# Patient Record
Sex: Male | Born: 1995 | Hispanic: Yes | Marital: Single | Smoking: Never smoker
Health system: Southern US, Community
[De-identification: ages and names within clinical notes are randomized; demographics above are authoritative.]

## PROBLEM LIST (undated history)

## (undated) HISTORY — PX: FRACTURE SURGERY: SHX138

---
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
## 2019-01-03 ENCOUNTER — Other Ambulatory Visit: Payer: Self-pay | Admitting: Chiropractor

## 2019-01-03 ENCOUNTER — Ambulatory Visit
Admission: RE | Admit: 2019-01-03 | Discharge: 2019-01-03 | Disposition: A | Discharge status: Home / Self Care | Payer: No Typology Code available for payment source | Source: Ambulatory Visit | Attending: Chiropractor | Admitting: Chiropractor

## 2019-01-03 DIAGNOSIS — M4326 Fusion of spine, lumbar region: Secondary | ICD-10-CM | POA: Diagnosis not present

## 2019-01-03 DIAGNOSIS — M546 Pain in thoracic spine: Secondary | ICD-10-CM | POA: Diagnosis not present

## 2019-01-03 DIAGNOSIS — M545 Low back pain: Secondary | ICD-10-CM | POA: Insufficient documentation

## 2019-09-10 ENCOUNTER — Ambulatory Visit: Payer: Medicaid Other | Attending: Internal Medicine

## 2019-09-10 DIAGNOSIS — Z23 Encounter for immunization: Secondary | ICD-10-CM

## 2019-09-10 NOTE — Progress Notes (Signed)
   Covid-19 Vaccination Clinic  Name:  Elijah Jacobs    MRN: [id number] DOB: 1995-10-12  09/10/2019  Mr. Elijah Jacobs was observed post Covid-19 immunization for 15 minutes without incident. He was provided with Vaccine Information Sheet and instruction to access the V-Safe system.   Mr. Elijah Jacobs was instructed to call 911 with any severe reactions post vaccine: Marland Kitchen Difficulty breathing  . Swelling of face and throat  . A fast heartbeat  . A bad rash all over body  . Dizziness and weakness   Immunizations Administered    Name Date Dose VIS Date Route   Pfizer COVID-19 Vaccine 09/10/2019 11:56 AM 0.3 mL 06/15/2018 Intramuscular   Manufacturer: [employer], [employer]   Lot: [id number]   NDC: [address]-7

## 2019-10-01 ENCOUNTER — Ambulatory Visit: Payer: Medicaid Other | Attending: Internal Medicine

## 2019-10-01 DIAGNOSIS — Z23 Encounter for immunization: Secondary | ICD-10-CM

## 2019-10-01 NOTE — Progress Notes (Signed)
   Covid-19 Vaccination Clinic  Name:  Elijah Jacobs    MRN: [id number] DOB: 1995-07-29  10/01/2019  Mr. Elijah Jacobs was observed post Covid-19 immunization for 15 minutes without incident. He was provided with Vaccine Information Sheet and instruction to access the V-Safe system.   Mr. Elijah Jacobs was instructed to call 911 with any severe reactions post vaccine: Marland Kitchen Difficulty breathing  . Swelling of face and throat  . A fast heartbeat  . A bad rash all over body  . Dizziness and weakness   Immunizations Administered    Name Date Dose VIS Date Route   Pfizer COVID-19 Vaccine 10/01/2019 12:12 PM 0.3 mL 06/15/2018 Intramuscular   Manufacturer: [employer], [employer]   Lot: [id number]   NDC: [address]-7
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
